# Patient Record
Sex: Male | Born: 1985 | Hispanic: No | Marital: Married | Smoking: Never smoker
Health system: Southern US, Community
[De-identification: ages and names within clinical notes are randomized; demographics above are authoritative.]

## PROBLEM LIST (undated history)

## (undated) DIAGNOSIS — K219 Gastro-esophageal reflux disease without esophagitis: Secondary | ICD-10-CM

## (undated) DIAGNOSIS — R197 Diarrhea, unspecified: Secondary | ICD-10-CM

## (undated) HISTORY — DX: Gastro-esophageal reflux disease without esophagitis: K21.9

## (undated) HISTORY — PX: NO PAST SURGERIES: SHX2092

## (undated) HISTORY — DX: Diarrhea, unspecified: R19.7

---

## 2014-01-16 ENCOUNTER — Encounter (HOSPITAL_COMMUNITY): Payer: Self-pay | Admitting: Emergency Medicine

## 2014-01-16 ENCOUNTER — Emergency Department (HOSPITAL_COMMUNITY)
Admission: EM | Admit: 2014-01-16 | Discharge: 2014-01-16 | Disposition: A | Discharge status: Home / Self Care | Payer: 59 | Attending: Emergency Medicine | Admitting: Emergency Medicine

## 2014-01-16 DIAGNOSIS — R51 Headache: Secondary | ICD-10-CM | POA: Insufficient documentation

## 2014-01-16 DIAGNOSIS — R519 Headache, unspecified: Secondary | ICD-10-CM

## 2014-01-16 DIAGNOSIS — H53149 Visual discomfort, unspecified: Secondary | ICD-10-CM | POA: Insufficient documentation

## 2014-01-16 MED ORDER — SODIUM CHLORIDE 0.9 % IV BOLUS (SEPSIS)
1000.0000 mL | Freq: Once | INTRAVENOUS | Status: AC
  Administered 2014-01-16: 1000 mL via INTRAVENOUS

## 2014-01-16 MED ORDER — TRAMADOL HCL 50 MG PO TABS: 50.0000 mg | ORAL_TABLET | Freq: Four times a day (QID) | ORAL | Status: AC | PRN

## 2014-01-16 MED ORDER — ONDANSETRON 4 MG PO TBDP: 4.0000 mg | ORAL_TABLET | Freq: Three times a day (TID) | ORAL | Status: DC | PRN

## 2014-01-16 MED ORDER — METOCLOPRAMIDE HCL 5 MG/ML IJ SOLN
10.0000 mg | Freq: Once | INTRAMUSCULAR | Status: AC
  Administered 2014-01-16: 10 mg via INTRAVENOUS
  Filled 2014-01-16: qty 2

## 2014-01-16 MED ORDER — DIPHENHYDRAMINE HCL 50 MG/ML IJ SOLN
25.0000 mg | Freq: Once | INTRAMUSCULAR | Status: AC
  Administered 2014-01-16: 25 mg via INTRAVENOUS
  Filled 2014-01-16: qty 1

## 2014-01-16 MED ORDER — KETOROLAC TROMETHAMINE 30 MG/ML IJ SOLN
30.0000 mg | Freq: Once | INTRAMUSCULAR | Status: AC
  Administered 2014-01-16: 30 mg via INTRAVENOUS
  Filled 2014-01-16: qty 1

## 2014-01-16 NOTE — Discharge Instructions (Signed)
Take Tramadol as needed for pain. Take zofran as needed for nausea. Refer to attached documents for more information. Follow up with a Primary Care Provider from the resource guide below.    Emergency Department Resource Guide 1) Find a Doctor and Pay Out of Pocket Although you won't have to find out who is covered by your insurance plan, it is a good idea to ask around and get recommendations. You will then need to call the office and see if the doctor you have chosen will accept you as a new patient and what types of options they offer for patients who are self-pay. Some doctors offer discounts or will set up payment plans for their patients who do not have insurance, but you will need to ask so you aren't surprised when you get to your appointment.  2) Contact Your Local Health Department Not all health departments have doctors that can see patients for sick visits, but many do, so it is worth a call to see if yours does. If you don't know where your local health department is, you can check in your phone book. The CDC also has a tool to help you locate your state's health department, and many state websites also have listings of all of their local health departments.  3) Find a Walk-in Clinic If your illness is not likely to be very severe or complicated, you may want to try a walk in clinic. These are popping up all over the country in pharmacies, drugstores, and shopping centers. They're usually staffed by nurse practitioners or physician assistants that have been trained to treat common illnesses and complaints. They're usually fairly quick and inexpensive. However, if you have serious medical issues or chronic medical problems, these are probably not your best option.  No Primary Care Doctor: - Call Health Connect at  [phone] - they can help you locate a primary care doctor that  accepts your insurance, provides certain services, etc. - Physician Referral Service- [phone]  Chronic  Pain Problems: Organization         Address  Phone   Notes  [facility]  [phone]) [phone] Patients need to be referred by their primary care doctor.   Medication Assistance: Organization         Address  Phone   Notes  [facility] [address] [place], [place] [address] [phone]) [phone] --Must be a resident of [place] -- Must have NO insurance coverage whatsoever (no Medicaid/ Medicare, etc.) -- The pt. MUST have a primary care doctor that directs their care regularly and follows them in the community   Med[facility]ist  [phone]   [employer]  [phone]    Agencies that provide inexpensive medical care: Organization         Address  Phone   Notes  [facility]  [phone]) [phone]   [facility]    [phone]   [facility] [address] [place], [place] [address] [phone]   [facility] [address], [place] [phone]) [phone]   [facility]anned Parenthood    [phone]   [facility]    [phone]   [facility]  [address], [place] Phone:  [phone], Fax:  [phone]) [phone] Hours of Operation:  9 am - 6 pm, M-F.  Also accepts Medicaid/Medicare and self-pay.  [facility]  [address], [place] Phone: [phone], Fax: [phone]. Hours of Operation:  8:30 am - 5:30 pm, M-F.  Also accepts Medicaid and self-pay.  °[facility] [place] [address], [place] Phone: [phone]   °[facility] [address], [place], [place] [phone], Ext. [phone] Mondays & Thursdays: 7-9 AM.  First 15 patients are seen on a first come, first serve basis. °  ° °Medicaid-accepting [place] Providers: ° °Organization         Address  Phone   Notes  °[facility] [address], [place] [phone] Also  accepts self-pay patients.  °[facility] [address], [place] ° [phone]   °[facility] [address], [place] [phone]   °[facility] [address] [place] [address], [place] [phone]   °Veita Bland [address], [place]  ° [phone] Only accepts [place] Access Medicaid patients after they have their name applied to their card.  ° °Self-Pay (no insurance) in [place]: ° °Organization         Address  Phone   Notes  °Sickle Cell Patients, [facility] [address], [place] [phone]   °[facility] [address], [place] [phone]   °[facility] [place] ° [address], [place] [phone]   °[facility]/Dr. Osei-Bonsu ° [address] [place] [address], [place] or [address], [place] [phone] Phone number for both [place] and [place] locations is the same.  °[facility] [address], [place] [phone]   °[facility] [address] [place] [address], [place] or [address] [phone]) [phone] °([phone]) [phone]   °[facility] [address], [place] [phone], phone; [phone], fax Sees patients 1st and 3rd Saturday of every month.  Must not qualify for public or private insurance (i.e. Medicaid, Medicare, [place] Health Choice, Veterans' Benefits) • Household income should be no more than 200% of the poverty level •The clinic cannot treat you if you are pregnant or think you are pregnant • Sexually transmitted diseases are not treated at the clinic.  ° ° °Dental Care: °Organization         Address  Phone  Notes  °[place] [facility] [address], [place] [phone]) [phone] Accepts children up to age 21 who are enrolled in Medicaid or [place] Health Choice; pregnant  women with a Medicaid card; and children who have applied for Medicaid or [place] Health Choice, but were declined, whose parents can pay a reduced fee at time of service.  °[place] [facility] [place]  [address], [place] [phone]) [phone] Accepts children up to age 21 who are enrolled in Medicaid or [place] Health Choice; pregnant women with a Medicaid card; and children who have applied for Medicaid or  Health Choice, but were declined, whose parents can pay a reduced fee at time of service.  °[facility] ° [address],  [phone] Patients are seen by appointment only. Walk-ins are not accepted. [facility] will see patients 18 years of age and older. °Monday - Tuesday (8am-5pm) °Most Wednesdays (8:30-5pm) °$30 per visit, cash only  °[facility]  [address], [facility] [phone] Patients are seen by appointment only. Walk-ins are not accepted. [place] will see patients 60 years of age and older. One Wednesday Evening (Monthly: Volunteer Based).  $30 per visit, cash only  [place]  [phone] for adults; Children under age 4, call Graduate Pediatric Dentistry at [phone]. Children aged 49-14, please call [phone] to request a pediatric application.  Dental services are provided in all areas of dental care including fillings, crowns and bridges, complete and partial dentures, implants, gum treatment, root canals, and extractions. Preventive care is also provided. Treatment is provided to both adults and children. Patients are selected via a lottery and there is often a waiting list.   [facility] [address] [place]  [phone] [email or web address]   [facility] [address] [place], [place] [phone], Ext. [phone] Second and Fourth Thursday of each month, opens at 6:30 AM; Clinic ends at 9 AM.  Patients are  seen on a first-come first-served basis, and a limited number are seen during each clinic.   [facility]  [address] Hillard Danker [place], [place] [phone]   Eligibility Requirements You must have lived in [place], [place], or [place] counties for at least the last three months.   You cannot be eligible for state or federal sponsored [employer], including [employer], [place], or [employer].   You generally cannot be eligible for healthcare insurance through your employer.    How to apply: Eligibility screenings are held every Tuesday and Wednesday afternoon from 1:00 pm until 4:00 pm. You do not need an appointment for the interview!  [facility] [address] [place], [place]   [place] Department  [place]  [phone]    Behavioral Health Resources in the Community: Intensive Outpatient Programs Organization         Address  Phone  Notes  [facility] [place]. [address], [place], [place] [phone]   [facility] [address], [place], [place]   [facility]: [facility] [address] [place] [address], [place], [place]   [place] [address],  [place], [place] or [phone]   Substance Abuse Resources Organization         Address  Phone  Notes  [facility]  [phone]   [place]  [phone]   [facility] [place]   Chinita Pester  [phone]   Residential & Outpatient Substance Abuse Program  [phone]   Psychological Services Organization         Address  Phone  Notes  [facility] [place]  [place]  [phone]   [place] [address] [place] [phone] or [phone]    Mobile Crisis  Teams Organization         Address  Phone  Notes  [facility], Mobile Crisis Care Unit  [phone]   [facility]  [address] [place], [place]   [facility] [address] [place] [phone]    Self-Help/Support Groups Organization         Address  Phone             Notes  [facility]  [facility] [place] - variety of support groups  [place] Call for more information  Narcotics Anonymous (NA), [facility] [address], [employer] [place]  2 meetings at this location   Special educational needs teacher         Address  Phone  Notes  [facility] [place],    [place]  [phone]   [facility]  [address], [place] [address], [place], [place]   [place], [place] [phone] Admissions: 8am-3pm M-F  Incentives Substance [place] [address],    [place], [place] [phone]   [facility] [address] [place], [place], [place]   [facility] [address]  [place], [place]   [facility] - Intensive Outpatient [address], [place], [place]   [facility] [place]  [place], [place] [phone] or [phone]   [facility] ([facility]) [address] [place], [place] Accepts Medicaid  [facility] [place] [address],  [place] [phone] Substance Abuse/Addiction Treatment   [facility] Organization         Address  Phone  Notes  [facility]  [phone]   Domenic Schwab, PhD [address] Arlis Porta [place], [place]   [phone] or [phone]   [place], [place] [phone]   [facility] [address], [place], [place] [phone]  Insurance/Medicaid/sponsorship through [facility] [address] [place] [address]                                    [place], [place] [phone] [place] [address], [place] [phone]    Dr. Adele Schilder  [phone]   [facility] Dept. 1) [address] [place] 2) [place] 3)  [address] [address], [place] [phone]  [phone]   [place] [phone] or [phone] (After Hours)

## 2014-01-16 NOTE — ED Provider Notes (Signed)
Medical screening examination/treatment/procedure(s) were performed by non-physician practitioner and as supervising physician I was immediately available for consultation/collaboration.   EKG Interpretation None        Charles B. Bernette MayersSheldon, MD 01/16/14 1145

## 2014-01-16 NOTE — ED Provider Notes (Signed)
CSN: [id number]     Arrival date & time 01/16/14  0913 History   First MD Initiated Contact with Patient 01/16/14 [phone]     Chief Complaint  Patient presents with  . Headache     (Consider location/radiation/quality/duration/timing/severity/associated sxs/prior Treatment) HPI Comments: Patient is a 28 year old male with a no significant past medical history who presents with a headache for 1 month. Patient reports a gradual onset and progressive worsening of the headache. The pain is sharp, constant and is located in his right head without radiation. Patient has tried nothing for symptoms without relief. No alleviating/aggravating factors. Patient reports associated photophobia. Patient denies fever, nausea, vomiting, diarrhea, numbness/tingling, weakness, visual changes, congestion, chest pain, SOB, abdominal pain.      History reviewed. No pertinent past medical history. History reviewed. No pertinent past surgical history. History reviewed. No pertinent family history. History  Substance Use Topics  . Smoking status: Never Smoker   . Smokeless tobacco: Not on file  . Alcohol Use: 1.2 oz/week    2 Cans of beer per week    Review of Systems  Constitutional: Negative for fever, chills and fatigue.  HENT: Negative for trouble swallowing.   Eyes: Negative for visual disturbance.  Respiratory: Negative for shortness of breath.   Cardiovascular: Negative for chest pain and palpitations.  Gastrointestinal: Negative for nausea, vomiting, abdominal pain and diarrhea.  Genitourinary: Negative for dysuria and difficulty urinating.  Musculoskeletal: Negative for arthralgias and neck pain.  Skin: Negative for color change.  Neurological: Positive for headaches. Negative for dizziness and weakness.  Psychiatric/Behavioral: Negative for dysphoric mood.      Allergies  Review of patient's allergies indicates no known allergies.  Home Medications  No current outpatient prescriptions on  file. BP 103/60  Pulse 65  Temp(Src) 97.3 F (36.3 C) (Oral)  Resp 16  Ht 5\' 4"  (1.626 m)  Wt 162 lb 5 oz (73.624 kg)  BMI 27.85 kg/m2  SpO2 100% Physical Exam  Nursing note and vitals reviewed. Constitutional: He is oriented to person, place, and time. He appears well-developed and well-nourished. No distress.  HENT:  Head: Normocephalic and atraumatic.  Eyes: Conjunctivae and EOM are normal. Pupils are equal, round, and reactive to light.  Neck: Normal range of motion.  Cardiovascular: Normal rate and regular rhythm.  Exam reveals no gallop and no friction rub.   No murmur heard. Pulmonary/Chest: Effort normal and breath sounds normal. He has no wheezes. He has no rales. He exhibits no tenderness.  Abdominal: Soft. There is no tenderness.  Musculoskeletal: Normal range of motion.  Neurological: He is alert and oriented to person, place, and time. Coordination normal.  No meningeal signs. Speech is goal-oriented. Moves limbs without ataxia.   Skin: Skin is warm and dry.  Psychiatric: He has a normal mood and affect. His behavior is normal.    ED Course  Procedures (including critical care time) Labs Review Labs Reviewed - No data to display Imaging Review No results found.   EKG Interpretation None      MDM   Final diagnoses:  Headache    11:27 AM Patient feeling better after migraine cocktail of fluids, toradol, reglan, and benadryl. Patient reports improvement of symptoms. I doubt life threatening etiology due to 1 month history of symptoms. No neuro deficits. Vitals stable and patient.     Emilia BeckKaitlyn Mayer Vondrak, PA-C 01/16/14 1143

## 2014-01-16 NOTE — ED Notes (Signed)
PT reports "dull" right sided HA x 1 month. Reports hx of same, but states "this one is lasting longer." Denies blurred vision/double vision. Neuro intact. Denies taking anything for pain.

## 2015-07-19 ENCOUNTER — Encounter (HOSPITAL_COMMUNITY): Payer: Self-pay | Admitting: Emergency Medicine

## 2015-07-19 ENCOUNTER — Emergency Department (HOSPITAL_COMMUNITY)
Admission: EM | Admit: 2015-07-19 | Discharge: 2015-07-20 | Disposition: A | Discharge status: Home / Self Care | Payer: 59 | Attending: Emergency Medicine | Admitting: Emergency Medicine

## 2015-07-19 DIAGNOSIS — X58XXXA Exposure to other specified factors, initial encounter: Secondary | ICD-10-CM | POA: Insufficient documentation

## 2015-07-19 DIAGNOSIS — Y9289 Other specified places as the place of occurrence of the external cause: Secondary | ICD-10-CM | POA: Insufficient documentation

## 2015-07-19 DIAGNOSIS — T551X1A Toxic effect of detergents, accidental (unintentional), initial encounter: Secondary | ICD-10-CM | POA: Insufficient documentation

## 2015-07-19 DIAGNOSIS — Y998 Other external cause status: Secondary | ICD-10-CM | POA: Insufficient documentation

## 2015-07-19 DIAGNOSIS — H5713 Ocular pain, bilateral: Secondary | ICD-10-CM | POA: Insufficient documentation

## 2015-07-19 DIAGNOSIS — Y9389 Activity, other specified: Secondary | ICD-10-CM | POA: Insufficient documentation

## 2015-07-19 DIAGNOSIS — H5789 Other specified disorders of eye and adnexa: Secondary | ICD-10-CM

## 2015-07-19 DIAGNOSIS — Z77098 Contact with and (suspected) exposure to other hazardous, chiefly nonmedicinal, chemicals: Secondary | ICD-10-CM

## 2015-07-19 MED ORDER — TETRACAINE HCL 0.5 % OP SOLN
1.0000 [drp] | Freq: Once | OPHTHALMIC | Status: AC
Start: 2015-07-20 — End: 2015-07-19
  Administered 2015-07-19: 1 [drp] via OPHTHALMIC
  Filled 2015-07-19: qty 2

## 2015-07-19 MED ORDER — FLUORESCEIN SODIUM 1 MG OP STRP
1.0000 | ORAL_STRIP | Freq: Once | OPHTHALMIC | Status: AC
  Administered 2015-07-19: 1 via OPHTHALMIC
  Filled 2015-07-19: qty 1

## 2015-07-19 NOTE — ED Provider Notes (Signed)
CSN: [id number]     Arrival date & time 07/19/15  2330 History   First MD Initiated Contact with Patient 07/19/15 2352     Chief Complaint  Patient presents with  . Eye Pain     (Consider location/radiation/quality/duration/timing/severity/associated sxs/prior Treatment) Patient is a 29 y.o. male presenting with eye pain. The history is provided by the patient and medical records. No language interpreter was used.  Eye Pain Pertinent negatives include no congestion, fever, headaches or rash.     Ascencion Coye is a 29 y.o. male  with no medical Hx presents to the Emergency Department complaining of gradual, persistent, progressively worsening bilateral eye pain onset ago after splashing liquid laundry detergent in his eyes.  Pt denies blurred or double vision.  He washed his eyes with some relief.  Pt does not wear glasses or contacts. Pt denies facial rash, URI symptoms, fever, chills, headache, congestion.     History reviewed. No pertinent past medical history. History reviewed. No pertinent past surgical history. History reviewed. No pertinent family history. Social History  Substance Use Topics  . Smoking status: Never Smoker   . Smokeless tobacco: None  . Alcohol Use: 1.2 oz/week    2 Cans of beer per week    Review of Systems  Constitutional: Negative for fever.  HENT: Negative for congestion, postnasal drip and rhinorrhea.   Eyes: Positive for pain and redness.  Skin: Negative for rash.  Allergic/Immunologic: Negative for immunocompromised state.  Neurological: Negative for headaches.      Allergies  Review of patient's allergies indicates no known allergies.  Home Medications   Prior to Admission medications   Medication Sig Start Date End Date Taking? Authorizing Provider  ondansetron (ZOFRAN ODT) 4 MG disintegrating tablet Take 1 tablet (4 mg total) by mouth every 8 (eight) hours as needed for nausea or vomiting. 01/16/14   Emilia Beck, PA-C  traMADol  (ULTRAM) 50 MG tablet Take 1 tablet (50 mg total) by mouth every 6 (six) hours as needed. 01/16/14   Kaitlyn Szekalski, PA-C   BP 126/74 mmHg  Pulse 85  Temp(Src) 97.7 F (36.5 C) (Oral)  Resp 18  Ht 5' 4.96" (1.65 m)  SpO2 100% Physical Exam  Constitutional: He is oriented to person, place, and time. He appears well-developed and well-nourished. No distress.  HENT:  Head: Normocephalic and atraumatic.  Nose: Nose normal. No mucosal edema or rhinorrhea.  Mouth/Throat: Uvula is midline, oropharynx is clear and moist and mucous membranes are normal. No uvula swelling. No oropharyngeal exudate, posterior oropharyngeal edema, posterior oropharyngeal erythema or tonsillar abscesses.  Eyes: EOM and lids are normal. Pupils are equal, round, and reactive to light. Lids are everted and swept, no foreign bodies found. Right eye exhibits no chemosis, no discharge and no exudate. No foreign body present in the right eye. Left eye exhibits no chemosis, no discharge and no exudate. No foreign body present in the left eye. Right conjunctiva is injected. Right conjunctiva has no hemorrhage. Left conjunctiva is injected. Left conjunctiva has no hemorrhage.  Slit lamp exam:      The right eye shows no corneal abrasion, no corneal flare, no corneal ulcer, no foreign body, no fluorescein uptake and no anterior chamber bulge.       The left eye shows no corneal abrasion, no corneal flare, no corneal ulcer, no foreign body, no fluorescein uptake and no anterior chamber bulge.  Pupils equal round and reactive to light No vertical, horizontal or rotational nystagmus No  Corneal abrasion noted to the bilateral eyes  No visible foreign body No corneal flare, ulcer or dendritic staining  No herpetic lesions to the face or around the eye  pH: 7.5  Visual Acuity:  Bilateral Near: 20/30 ; R Near: 20/30 ; L Near: 20/30  Neck: Normal range of motion.  Full range of motion without pain No midline or paraspinal  tenderness No nuchal rigidity; no meningeal signs  Cardiovascular: Normal rate, regular rhythm and intact distal pulses.   Pulmonary/Chest: Effort normal. No respiratory distress.  Musculoskeletal: Normal range of motion.  Neurological: He is alert and oriented to person, place, and time.  Mental Status:  Alert, oriented, thought content appropriate. Speech fluent without evidence of aphasia. Able to follow 2 step commands without difficulty.   Cranial Nerves:  II:  Peripheral visual fields grossly normal, pupils equal, round, reactive to light III,IV, VI: ptosis not present, extra-ocular motions intact bilaterally  V,VII: smile symmetric, facial light touch sensation equal VIII: hearing grossly normal bilaterally  IX,X: gag reflex present  XI: bilateral shoulder shrug equal and strong XII: midline tongue extension   Skin: Skin is warm and dry. He is not diaphoretic. No erythema.  Psychiatric: He has a normal mood and affect.  Nursing note and vitals reviewed.   ED Course  Procedures (including critical care time) Labs Review Labs Reviewed - No data to display  Imaging Review No results found. I have personally reviewed and evaluated these images and lab results as part of my medical decision-making.   EKG Interpretation None      MDM   Final diagnoses:  Eye irritation  Chemical exposure of eye   Daylene Posey presents with eye pain and redness after getting laundry detergent in his eyes.  Initial pH 7.5.  Will flush eyes with morgan lens.    12:58 AM Pt with resolved pain, improved redness and repeat pH of 7.5 after flushing.  Will d/c to home.  No evidence of cornea abrasion.  Pt to f/u with ophthalmology in 2 days.    BP 126/74 mmHg  Pulse 85  Temp(Src) 97.7 F (36.5 C) (Oral)  Resp 18  Ht 5' 4.96" (1.65 m)  SpO2 100%    Dierdre Forth, PA-C 07/20/15 0101  April Palumbo, MD 07/20/15 [phone]

## 2015-07-19 NOTE — ED Notes (Signed)
Pt reports he got laundry detergent in eyes 15 mins ago. Eyes appear red and are burning.

## 2015-07-20 NOTE — Discharge Instructions (Signed)
1. Medications: usual home medications 2. Treatment: rest, drink plenty of fluids,  3. Follow Up: Please followup with ophthalmology in 2 days for discussion of your diagnoses and further evaluation after today's visit; Please return to the ED for worsening symptoms, vision changes or other concerns

## 2015-11-28 ENCOUNTER — Encounter: Payer: Self-pay | Admitting: Physician Assistant

## 2015-12-09 ENCOUNTER — Other Ambulatory Visit: Payer: Self-pay | Admitting: Nurse Practitioner

## 2015-12-09 ENCOUNTER — Ambulatory Visit
Admission: RE | Admit: 2015-12-09 | Discharge: 2015-12-09 | Disposition: A | Discharge status: Home / Self Care | Payer: 59 | Source: Ambulatory Visit | Attending: Nurse Practitioner | Admitting: Nurse Practitioner

## 2015-12-09 DIAGNOSIS — R52 Pain, unspecified: Secondary | ICD-10-CM

## 2015-12-25 ENCOUNTER — Encounter: Payer: Self-pay | Admitting: Gastroenterology

## 2015-12-26 ENCOUNTER — Other Ambulatory Visit: Payer: Self-pay | Admitting: *Deleted

## 2015-12-26 ENCOUNTER — Other Ambulatory Visit (INDEPENDENT_AMBULATORY_CARE_PROVIDER_SITE_OTHER): Payer: 59

## 2015-12-26 ENCOUNTER — Ambulatory Visit (INDEPENDENT_AMBULATORY_CARE_PROVIDER_SITE_OTHER): Payer: 59 | Admitting: Physician Assistant

## 2015-12-26 ENCOUNTER — Encounter: Payer: Self-pay | Admitting: Physician Assistant

## 2015-12-26 VITALS — BP 100/60 | HR 72 | Ht 64.0 in | Wt 163.8 lb

## 2015-12-26 DIAGNOSIS — Z21 Asymptomatic human immunodeficiency virus [HIV] infection status: Secondary | ICD-10-CM | POA: Diagnosis not present

## 2015-12-26 DIAGNOSIS — R197 Diarrhea, unspecified: Secondary | ICD-10-CM

## 2015-12-26 DIAGNOSIS — R112 Nausea with vomiting, unspecified: Secondary | ICD-10-CM

## 2015-12-26 DIAGNOSIS — R634 Abnormal weight loss: Secondary | ICD-10-CM

## 2015-12-26 DIAGNOSIS — K219 Gastro-esophageal reflux disease without esophagitis: Secondary | ICD-10-CM

## 2015-12-26 DIAGNOSIS — R1084 Generalized abdominal pain: Secondary | ICD-10-CM

## 2015-12-26 LAB — COMPREHENSIVE METABOLIC PANEL
ALBUMIN: 4.6 g/dL (ref 3.5–5.2)
ALT: 22 U/L (ref 0–53)
AST: 19 U/L (ref 0–37)
Alkaline Phosphatase: 53 U/L (ref 39–117)
BUN: 12 mg/dL (ref 6–23)
CALCIUM: 9.3 mg/dL (ref 8.4–10.5)
CHLORIDE: 103 meq/L (ref 96–112)
CO2: 30 mEq/L (ref 19–32)
Creatinine, Ser: 0.71 mg/dL (ref 0.40–1.50)
GFR: 136.88 mL/min (ref >60.00)
Glucose, Bld: 98 mg/dL (ref 70–99)
Potassium: 4 mEq/L (ref 3.5–5.1)
Sodium: 137 mEq/L (ref 135–145)
Total Bilirubin: 1.2 mg/dL (ref 0.2–1.2)
Total Protein: 8 g/dL (ref 6.0–8.3)

## 2015-12-26 LAB — CBC WITH DIFFERENTIAL/PLATELET
Basophils Absolute: 0 10*3/uL (ref 0.0–0.1)
Basophils Relative: 0.5 % (ref 0.0–3.0)
EOS ABS: 0.4 10*3/uL (ref 0.0–0.7)
EOS PCT: 5.4 % — AB (ref 0.0–5.0)
HEMATOCRIT: 48.9 % (ref 39.0–52.0)
HEMOGLOBIN: 16.9 g/dL (ref 13.0–17.0)
Lymphocytes Relative: 27.1 % (ref 12.0–46.0)
Lymphs Abs: 2 10*3/uL (ref 0.7–4.0)
MCHC: 34.5 g/dL (ref 30.0–36.0)
MCV: 83.4 fl (ref 78.0–100.0)
MONOS PCT: 5.9 % (ref 3.0–12.0)
Monocytes Absolute: 0.4 10*3/uL (ref 0.1–1.0)
NEUTROS ABS: 4.5 10*3/uL (ref 1.4–7.7)
Neutrophils Relative %: 61.1 % (ref 43.0–77.0)
PLATELETS: 177 10*3/uL (ref 150.0–400.0)
RBC: 5.87 Mil/uL — ABNORMAL HIGH (ref 4.22–5.81)
RDW: 13.2 % (ref 11.5–15.5)
WBC: 7.4 10*3/uL (ref 4.0–10.5)

## 2015-12-26 LAB — TSH: TSH: 1.29 u[IU]/mL (ref 0.35–4.50)

## 2015-12-26 LAB — IGA: IgA: 209 mg/dL (ref 68–378)

## 2015-12-26 LAB — LIPASE: LIPASE: 12 U/L (ref 11.0–59.0)

## 2015-12-26 LAB — AMYLASE: Amylase: 45 U/L (ref 27–131)

## 2015-12-26 MED ORDER — NA SULFATE-K SULFATE-MG SULF 17.5-3.13-1.6 GM/177ML PO SOLN: 1.0000 | Freq: Once | ORAL | Status: AC

## 2015-12-26 MED ORDER — PANTOPRAZOLE SODIUM 40 MG PO TBEC: 40.0000 mg | DELAYED_RELEASE_TABLET | Freq: Every day | ORAL | Status: DC

## 2015-12-26 NOTE — Patient Instructions (Addendum)
Please go to the basement level to have your labs drawn.  We sent a prescription for [facility] for Pantoprazole sodium 40 mg. , Suprep for the colonoscopy prep.  You have been scheduled for an endoscopy and colonoscopy. Please follow the written instructions given to you at your visit today.  If you use inhalers (even only as needed), please bring them with you on the day of your procedure. Your physician has requested that you go to www.startemmi.com and enter the access code given to you at your visit today. This web site gives a general overview about your procedure. However, you should still follow specific instructions given to you by our office regarding your preparation for the procedure.   You have been scheduled for a CT scan of the abdomen and pelvis at [facility] ([address]---this is in the same building as Press photographer).   You are scheduled on 01-02-2016 at 9:30 . You should arrive at 9:15 am  prior to your appointment time for registration. Please follow the written instructions below on the day of your exam:  WARNING: IF YOU ARE ALLERGIC TO IODINE/X-RAY DYE, PLEASE NOTIFY RADIOLOGY IMMEDIATELY AT [phone]! YOU WILL BE GIVEN A 13 HOUR PREMEDICATION PREP.  1) Do not eat or drink anything after 5:30 am  (4 hours prior to your test) 2) You have been given 2 bottles of oral contrast to drink. The solution may taste  better if refrigerated, but do NOT add ice or any other liquid to this solution. Shake well before drinking.    Drink 1 bottle of contrast @ 7:30 am (2 hours prior to your exam)  Drink 1 bottle of contrast @ 8:30 am  (1 hour prior to your exam)  You may take any medications as prescribed with a small amount of water except for the following: Metformin, Glucophage, Glucovance, Avandamet, Riomet, Fortamet, Actoplus Met, Janumet, Glumetza or Metaglip. The above medications must be held the day of the exam AND 48 hours after the  exam.  The purpose of you drinking the oral contrast is to aid in the visualization of your intestinal tract. The contrast solution may cause some diarrhea. Before your exam is started, you will be given a small amount of fluid to drink. Depending on your individual set of symptoms, you may also receive an intravenous injection of x-ray contrast/dye. Plan on being at [facility] for 30 minutes or long, depending on the type of exam you are having performed.  If you have any questions regarding your exam or if you need to reschedule, you may call the CT department at [phone] between the hours of 8:00 am and 5:00 pm, Monday-Friday.  ________________________________________________________________________
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
## 2015-12-27 LAB — HIV ANTIBODY (ROUTINE TESTING W REFLEX): HIV: NONREACTIVE

## 2015-12-29 LAB — TISSUE TRANSGLUTAMINASE, IGG: Tissue Transglut Ab: 5 U/mL (ref <6)

## 2015-12-29 LAB — OVA AND PARASITE EXAMINATION: OP: NONE SEEN

## 2015-12-29 LAB — CLOSTRIDIUM DIFFICILE BY PCR: CDIFFPCR: NOT DETECTED

## 2015-12-30 ENCOUNTER — Other Ambulatory Visit: Payer: 59

## 2015-12-30 DIAGNOSIS — R634 Abnormal weight loss: Secondary | ICD-10-CM

## 2015-12-31 LAB — HIV ANTIBODY (ROUTINE TESTING W REFLEX): HIV 1&2 Ab, 4th Generation: NONREACTIVE

## 2016-01-02 ENCOUNTER — Ambulatory Visit (INDEPENDENT_AMBULATORY_CARE_PROVIDER_SITE_OTHER)
Admission: RE | Admit: 2016-01-02 | Discharge: 2016-01-02 | Disposition: A | Discharge status: Home / Self Care | Payer: 59 | Source: Ambulatory Visit | Attending: Physician Assistant | Admitting: Physician Assistant

## 2016-01-02 DIAGNOSIS — R197 Diarrhea, unspecified: Secondary | ICD-10-CM

## 2016-01-02 DIAGNOSIS — R112 Nausea with vomiting, unspecified: Secondary | ICD-10-CM | POA: Diagnosis not present

## 2016-01-02 DIAGNOSIS — K219 Gastro-esophageal reflux disease without esophagitis: Secondary | ICD-10-CM

## 2016-01-02 DIAGNOSIS — R1084 Generalized abdominal pain: Secondary | ICD-10-CM | POA: Diagnosis not present

## 2016-01-02 DIAGNOSIS — R634 Abnormal weight loss: Secondary | ICD-10-CM | POA: Diagnosis not present

## 2016-01-02 MED ORDER — IOHEXOL 300 MG/ML  SOLN
100.0000 mL | Freq: Once | INTRAMUSCULAR | Status: AC | PRN
  Administered 2016-01-02: 100 mL via INTRAVENOUS

## 2016-01-30 ENCOUNTER — Ambulatory Visit (AMBULATORY_SURGERY_CENTER): Payer: Self-pay | Admitting: Internal Medicine

## 2016-01-30 VITALS — BP 113/60 | HR 68 | Temp 98.6°F | Ht 64.0 in | Wt 163.0 lb

## 2016-01-30 DIAGNOSIS — R197 Diarrhea, unspecified: Secondary | ICD-10-CM

## 2016-01-30 DIAGNOSIS — K219 Gastro-esophageal reflux disease without esophagitis: Secondary | ICD-10-CM

## 2016-01-30 MED ORDER — SODIUM CHLORIDE 0.9 % IV SOLN: 500.0000 mL | INTRAVENOUS | Status: DC

## 2016-01-30 NOTE — Progress Notes (Signed)
During the admitting process of this patient, it was found that he ate rice at 1100.  He also ate solid foods yesterday.  Per Dr Rhea BeltonPyrtle, we will reschedule him for another day and schedule a PV as well.  There is a language barrier and I think this was a big part of the problem with him following his prep instructions.  I will request a translator for both appointments for him.  Rescheduled procedure and made appointment for PV as well.  Pt verbalized understanding.

## 2016-02-20 ENCOUNTER — Ambulatory Visit (AMBULATORY_SURGERY_CENTER): Payer: Self-pay | Admitting: *Deleted

## 2016-02-20 VITALS — Ht 66.0 in | Wt 161.6 lb

## 2016-02-20 DIAGNOSIS — R1084 Generalized abdominal pain: Secondary | ICD-10-CM

## 2016-02-20 DIAGNOSIS — R197 Diarrhea, unspecified: Secondary | ICD-10-CM

## 2016-02-20 MED ORDER — NA SULFATE-K SULFATE-MG SULF 17.5-3.13-1.6 GM/177ML PO SOLN: 1.0000 | Freq: Once | ORAL | Status: DC

## 2016-02-20 NOTE — Progress Notes (Signed)
No egg or soy allergy known to patient  No i past sedation  No diet pills per patient No home 02 use per patient  No blood thinners per patient  Pt denies issues with constipation  Pt has colon 3-31 but ate foods the day before and the morning of due to language barrier.  Pt states prep was over 100.00 last time sample given today due to repeat per md Samples of this drug were given to the patient, quantity 1 suprep , Lot Number [id number] 3-19 Pt in Pv today with soloman, interpreter. Pt states last instructions he did not understand due to language barrier.  We discussed his instructions for prep, no foods JUST clears Monday and Tuesday multiple times. He states he understands this time. Instructed to call with any questions.

## 2016-02-24 ENCOUNTER — Ambulatory Visit (AMBULATORY_SURGERY_CENTER): Payer: 59 | Admitting: Internal Medicine

## 2016-02-24 ENCOUNTER — Encounter: Payer: Self-pay | Admitting: Internal Medicine

## 2016-02-24 VITALS — BP 96/50 | HR 63 | Temp 98.6°F | Resp 20 | Ht 66.0 in | Wt 161.0 lb

## 2016-02-24 DIAGNOSIS — R1013 Epigastric pain: Secondary | ICD-10-CM

## 2016-02-24 DIAGNOSIS — R197 Diarrhea, unspecified: Secondary | ICD-10-CM | POA: Diagnosis not present

## 2016-02-24 DIAGNOSIS — R112 Nausea with vomiting, unspecified: Secondary | ICD-10-CM

## 2016-02-24 DIAGNOSIS — K2950 Unspecified chronic gastritis without bleeding: Secondary | ICD-10-CM | POA: Diagnosis not present

## 2016-02-24 DIAGNOSIS — B9681 Helicobacter pylori [H. pylori] as the cause of diseases classified elsewhere: Secondary | ICD-10-CM | POA: Diagnosis not present

## 2016-02-24 MED ORDER — SODIUM CHLORIDE 0.9 % IV SOLN: 500.0000 mL | INTRAVENOUS | Status: DC

## 2016-02-24 MED ORDER — PANTOPRAZOLE SODIUM 40 MG PO TBEC: 40.0000 mg | DELAYED_RELEASE_TABLET | Freq: Every day | ORAL | Status: AC

## 2016-02-24 NOTE — Op Note (Signed)
[facility] Patient Name: Alvin Parker Procedure Date: 02/24/2016 2:21 PM MRN: [id number] Endoscopist: Beverley Fiedler , MD Age: 30 Date of Birth: [birth year] Gender: Male Procedure:                Colonoscopy Indications:              Epigastric abdominal pain, Incidental diarrhea noted Medicines:                Monitored Anesthesia Care Procedure:                Pre-Anesthesia Assessment:                           - Prior to the procedure, a History and Physical                            was performed, and patient medications and                            allergies were reviewed. The patient's tolerance of                            previous anesthesia was also reviewed. The risks                            and benefits of the procedure and the sedation                            options and risks were discussed with the patient.                            All questions were answered, and informed consent                            was obtained. Prior Anticoagulants: The patient has                            taken no previous anticoagulant or antiplatelet                            agents. ASA Grade Assessment: II - A patient with                            mild systemic disease. After reviewing the risks                            and benefits, the patient was deemed in                            satisfactory condition to undergo the procedure.                           After obtaining informed consent, the colonoscope  was passed under direct vision. Throughout the                            procedure, the patient's blood pressure, pulse, and                            oxygen saturations were monitored continuously. The                            Model CF-HQ190L [id number]) scope was introduced                            through the anus and advanced to the the terminal                            ileum. The colonoscopy was performed without                      difficulty. The patient tolerated the procedure                            well. The quality of the bowel preparation was                            excellent. The terminal ileum, ileocecal valve,                            appendiceal orifice, and rectum were photographed. Scope In: 2:22:34 PM Scope Out: 2:33:18 PM Scope Withdrawal Time: 0 hours 8 minutes 11 seconds  Total Procedure Duration: 0 hours 10 minutes 44 seconds  Findings:                 The perianal and digital rectal examinations were                            normal.                           The terminal ileum appeared normal.                           The entire examined colon appeared normal on direct                            and retroflexion views.                           Biopsies for histology were taken with a cold                            forceps from the right colon and left colon for                            evaluation of microscopic colitis. Complications:            No immediate complications. Estimated Blood Loss:  Estimated blood loss: none. Impression:               - The examined portion of the ileum was normal.                           - The entire examined colon is normal on direct and                            retroflexion views.                           - Biopsies were taken with a cold forceps from the                            right colon and left colon for evaluation of                            microscopic colitis. Recommendation:           - Patient has a contact number available for                            emergencies. The signs and symptoms of potential                            delayed complications were discussed with the                            patient. Return to normal activities tomorrow.                            Written discharge instructions were provided to the                            patient.                           - Resume previous  diet.                           - Continue present medications.                           - Await pathology results.                           - Repeat colonoscopy at age 32 for screening                            purposes. Beverley Fiedler, MD 02/24/2016 2:41:18 PM This report has been signed electronically.

## 2016-02-24 NOTE — Progress Notes (Signed)
Patient's interpreter at bedside assisting with instructions. Patient stating he is out of all medications. Called Dr. Rhea BeltonPyrtle , reorder for Protonix only. Patient aware to contact the prescribing provider for Tramadol refill.( prilosec discontinued and Zofran per admitting  Nurse has already been discontinued). Patient verbalized understanding.

## 2016-02-24 NOTE — Op Note (Signed)
[facility] Patient Name: Alvin Parker Procedure Date: 02/24/2016 2:00 PM MRN: [id number] Endoscopist: Beverley FiedlerJay M Windel Keziah , MD Age: [age] Date of Birth: 08/02/1986 Gender: Male Procedure:                Upper GI endoscopy Indications:              Epigastric abdominal pain, Diarrhea, Nausea with                            vomiting Medicines:                Monitored Anesthesia Care Procedure:                Pre-Anesthesia Assessment:                           - Prior to the procedure, a History and Physical                            was performed, and patient medications and                            allergies were reviewed. The patient's tolerance of                            previous anesthesia was also reviewed. The risks                            and benefits of the procedure and the sedation                            options and risks were discussed with the patient.                            All questions were answered, and informed consent                            was obtained. Prior Anticoagulants: The patient has                            taken no previous anticoagulant or antiplatelet                            agents. ASA Grade Assessment: II - A patient with                            mild systemic disease. After reviewing the risks                            and benefits, the patient was deemed in                            satisfactory condition to undergo the procedure.  After obtaining informed consent, the endoscope was                            passed under direct vision. Throughout the                            procedure, the patient's blood pressure, pulse, and                            oxygen saturations were monitored continuously. The                            Model GIF-HQ190 [id number] scope was introduced                            through the mouth, and advanced to the second part                            of duodenum.  The upper GI endoscopy was                            accomplished without difficulty. The patient                            tolerated the procedure well. Scope In: Scope Out: Findings:                 The examined esophagus was normal.                           Mild inflammation characterized by erythema was                            found in the gastric body. Biopsies were taken from                            the gastric body, antrum, and incisura with a cold                            forceps for histology and Helicobacter pylori                            testing.                           The examined duodenum was normal. Biopsies for                            histology were taken with a cold forceps for                            evaluation of celiac disease.                           The cardia and gastric fundus were normal on  retroflexion. Complications:            No immediate complications. Estimated Blood Loss:     Estimated blood loss: none. Impression:               - Normal esophagus.                           - Gastritis. Biopsied.                           - Normal examined duodenum. Biopsied. Recommendation:           - Patient has a contact number available for                            emergencies. The signs and symptoms of potential                            delayed complications were discussed with the                            patient. Return to normal activities tomorrow.                            Written discharge instructions were provided to the                            patient.                           - Resume previous diet.                           - Continue present medications.                           - Await pathology results.                           - Perform a colonoscopy tomorrow. Beverley Fiedler, MD 02/24/2016 2:37:22 PM This report has been signed electronically.

## 2016-02-24 NOTE — Patient Instructions (Signed)
YOU HAD AN ENDOSCOPIC PROCEDURE TODAY AT THE [facility]:   Refer to the procedure report that was given to you for any specific questions about what was found during the examination.  If the procedure report does not answer your questions, please call your gastroenterologist to clarify.  If you requested that your care partner not be given the details of your procedure findings, then the procedure report has been included in a sealed envelope for you to review at your convenience later.  YOU SHOULD EXPECT: Some feelings of bloating in the abdomen. Passage of more gas than usual.  Walking can help get rid of the air that was put into your GI tract during the procedure and reduce the bloating. If you had a lower endoscopy (such as a colonoscopy or flexible sigmoidoscopy) you may notice spotting of blood in your stool or on the toilet paper. If you underwent a bowel prep for your procedure, you may not have a normal bowel movement for a few days.  Please Note:  You might notice some irritation and congestion in your nose or some drainage.  This is from the oxygen used during your procedure.  There is no need for concern and it should clear up in a day or so.  SYMPTOMS TO REPORT IMMEDIATELY:   Following lower endoscopy (colonoscopy or flexible sigmoidoscopy):  Excessive amounts of blood in the stool  Significant tenderness or worsening of abdominal pains  Swelling of the abdomen that is new, acute  Fever of 100F or higher   Following upper endoscopy (EGD)  Vomiting of blood or coffee ground material  New chest pain or pain under the shoulder blades  Painful or persistently difficult swallowing  New shortness of breath  Fever of 100F or higher  Black, tarry-looking stools  For urgent or emergent issues, a gastroenterologist can be reached at any hour by calling [phone].   DIET: Your first meal following the procedure should be a small meal and then it is ok to progress to  your normal diet. Heavy or fried foods are harder to digest and may make you feel nauseous or bloated.  Likewise, meals heavy in dairy and vegetables can increase bloating.  Drink plenty of fluids but you should avoid alcoholic beverages for 24 hours.  ACTIVITY:  You should plan to take it easy for the rest of today and you should NOT DRIVE or use heavy machinery until tomorrow (because of the sedation medicines used during the test).    FOLLOW UP: Our staff will call the number listed on your records the next business day following your procedure to check on you and address any questions or concerns that you may have regarding the information given to you following your procedure. If we do not reach you, we will leave a message.  However, if you are feeling well and you are not experiencing any problems, there is no need to return our call.  We will assume that you have returned to your regular daily activities without incident.  If any biopsies were taken you will be contacted by phone or by letter within the next 1-3 weeks.  Please call us at [phone] if you have not heard about the biopsies in 3 weeks.    SIGNATURES/CONFIDENTIALITY: You and/or your care partner have signed paperwork which will be entered into your electronic medical record.  These signatures attest to the fact that that the information above on your After Visit Summary has been reviewed   and is understood.  Full responsibility of the confidentiality of this discharge information lies with you and/or your care-partner. 

## 2016-02-24 NOTE — Progress Notes (Signed)
Report to PACU, RN, vss, BBS= Clear.  

## 2016-02-24 NOTE — Progress Notes (Signed)
Called to room to assist during endoscopic procedure.  Patient ID and intended procedure confirmed with present staff. Received instructions for my participation in the procedure from the performing physician.  

## 2016-02-25 ENCOUNTER — Telehealth: Payer: Self-pay | Admitting: *Deleted

## 2016-02-25 NOTE — Telephone Encounter (Signed)
Left message, follow-up, no identifier

## 2016-03-01 ENCOUNTER — Encounter: Payer: Self-pay | Admitting: Internal Medicine

## 2016-03-04 ENCOUNTER — Other Ambulatory Visit: Payer: Self-pay

## 2016-03-04 ENCOUNTER — Telehealth: Payer: Self-pay | Admitting: Internal Medicine

## 2016-03-04 DIAGNOSIS — A048 Other specified bacterial intestinal infections: Secondary | ICD-10-CM

## 2016-03-04 MED ORDER — PANTOPRAZOLE SODIUM 40 MG PO TBEC: 40.0000 mg | DELAYED_RELEASE_TABLET | Freq: Two times a day (BID) | ORAL | Status: AC

## 2016-03-04 MED ORDER — BIS SUBCIT-METRONID-TETRACYC 140-125-125 MG PO CAPS: 3.0000 | ORAL_CAPSULE | Freq: Three times a day (TID) | ORAL | Status: AC

## 2016-03-04 NOTE — Telephone Encounter (Signed)
Reviewed results with pt over the phone and he knows to pick up his prescriptions.

## 2016-06-08 ENCOUNTER — Telehealth: Payer: Self-pay

## 2016-06-08 NOTE — Telephone Encounter (Signed)
Letter mailed to pt regarding H pylori stool antigen.

## 2016-06-08 NOTE — Telephone Encounter (Signed)
-----   Message from Chrystie NoseLinda R Georgina Krist, RN sent at 03/04/2016 11:38 AM EDT ----- Regarding: Stool antigen Pt needs H pylori stool antigen off PPI

## 2016-06-18 ENCOUNTER — Other Ambulatory Visit: Payer: 59

## 2016-06-18 DIAGNOSIS — K921 Melena: Secondary | ICD-10-CM

## 2016-06-21 LAB — HELICOBACTER PYLORI  SPECIAL ANTIGEN: H. PYLORI Antigen: DETECTED

## 2016-06-23 ENCOUNTER — Other Ambulatory Visit: Payer: Self-pay

## 2016-06-23 MED ORDER — CLARITHROMYCIN 500 MG PO TABS: 500.0000 mg | ORAL_TABLET | Freq: Two times a day (BID) | ORAL | 0 refills | Status: AC

## 2016-06-23 MED ORDER — PANTOPRAZOLE SODIUM 20 MG PO TBEC: 20.0000 mg | DELAYED_RELEASE_TABLET | Freq: Two times a day (BID) | ORAL | 0 refills | Status: AC

## 2016-06-23 MED ORDER — AMOXICILLIN 500 MG PO TABS: 1000.0000 mg | ORAL_TABLET | Freq: Two times a day (BID) | ORAL | 0 refills | Status: AC

## 2016-07-02 ENCOUNTER — Telehealth: Payer: Self-pay | Admitting: Internal Medicine

## 2016-07-06 NOTE — Telephone Encounter (Signed)
Spoke with pt and let him know that once he finishes the antibiotics and the protonix then he has completed the treatment.
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
